# Patient Record
Sex: Male | Born: 1950 | Race: Black or African American | Hispanic: No | Smoking: Former smoker
Health system: Southern US, Community
[De-identification: ages and names within clinical notes are randomized; demographics above are authoritative.]

## PROBLEM LIST (undated history)

## (undated) DIAGNOSIS — G8929 Other chronic pain: Secondary | ICD-10-CM

## (undated) DIAGNOSIS — E878 Other disorders of electrolyte and fluid balance, not elsewhere classified: Secondary | ICD-10-CM

## (undated) DIAGNOSIS — J9819 Other pulmonary collapse: Secondary | ICD-10-CM

## (undated) DIAGNOSIS — M549 Dorsalgia, unspecified: Secondary | ICD-10-CM

## (undated) DIAGNOSIS — I10 Essential (primary) hypertension: Secondary | ICD-10-CM

## (undated) HISTORY — PX: FRACTURE SURGERY: SHX138

---

## 2017-06-01 ENCOUNTER — Emergency Department
Admission: EM | Admit: 2017-06-01 | Discharge: 2017-06-01 | Disposition: A | Discharge status: Home / Self Care | Payer: Medicare Other | Attending: Emergency Medicine | Admitting: Emergency Medicine

## 2017-06-01 ENCOUNTER — Other Ambulatory Visit: Payer: Self-pay

## 2017-06-01 ENCOUNTER — Emergency Department: Payer: Medicare Other

## 2017-06-01 DIAGNOSIS — I10 Essential (primary) hypertension: Secondary | ICD-10-CM | POA: Insufficient documentation

## 2017-06-01 DIAGNOSIS — Y929 Unspecified place or not applicable: Secondary | ICD-10-CM | POA: Diagnosis not present

## 2017-06-01 DIAGNOSIS — Y939 Activity, unspecified: Secondary | ICD-10-CM | POA: Diagnosis not present

## 2017-06-01 DIAGNOSIS — Z87891 Personal history of nicotine dependence: Secondary | ICD-10-CM | POA: Diagnosis not present

## 2017-06-01 DIAGNOSIS — M898X1 Other specified disorders of bone, shoulder: Secondary | ICD-10-CM

## 2017-06-01 DIAGNOSIS — Y999 Unspecified external cause status: Secondary | ICD-10-CM | POA: Insufficient documentation

## 2017-06-01 DIAGNOSIS — X58XXXA Exposure to other specified factors, initial encounter: Secondary | ICD-10-CM | POA: Insufficient documentation

## 2017-06-01 DIAGNOSIS — S42018A Nondisplaced fracture of sternal end of left clavicle, initial encounter for closed fracture: Secondary | ICD-10-CM | POA: Diagnosis not present

## 2017-06-01 DIAGNOSIS — S4992XA Unspecified injury of left shoulder and upper arm, initial encounter: Secondary | ICD-10-CM | POA: Diagnosis present

## 2017-06-01 HISTORY — DX: Other pulmonary collapse: J98.19

## 2017-06-01 HISTORY — DX: Dorsalgia, unspecified: M54.9

## 2017-06-01 HISTORY — DX: Essential (primary) hypertension: I10

## 2017-06-01 HISTORY — DX: Other chronic pain: G89.29

## 2017-06-01 HISTORY — DX: Other disorders of electrolyte and fluid balance, not elsewhere classified: E87.8

## 2017-06-01 LAB — BASIC METABOLIC PANEL
ANION GAP: 10 (ref 5–15)
BUN: 21 mg/dL — ABNORMAL HIGH (ref 6–20)
CHLORIDE: 105 mmol/L (ref 101–111)
CO2: 21 mmol/L — AB (ref 22–32)
Calcium: 9.4 mg/dL (ref 8.9–10.3)
Creatinine, Ser: 1.08 mg/dL (ref 0.61–1.24)
GFR calc non Af Amer: >60 mL/min (ref >60)
GLUCOSE: 83 mg/dL (ref 65–99)
POTASSIUM: 3.9 mmol/L (ref 3.5–5.1)
Sodium: 136 mmol/L (ref 135–145)

## 2017-06-01 LAB — CBC
HCT: 44.8 % (ref 40.0–52.0)
HEMOGLOBIN: 14.8 g/dL (ref 13.0–18.0)
MCH: 29.7 pg (ref 26.0–34.0)
MCHC: 33 g/dL (ref 32.0–36.0)
MCV: 89.9 fL (ref 80.0–100.0)
Platelets: 215 10*3/uL (ref 150–440)
RBC: 4.98 MIL/uL (ref 4.40–5.90)
RDW: 15 % — ABNORMAL HIGH (ref 11.5–14.5)
WBC: 7.8 10*3/uL (ref 3.8–10.6)

## 2017-06-01 LAB — URIC ACID: URIC ACID, SERUM: 7.8 mg/dL — AB (ref 4.4–7.6)

## 2017-06-01 LAB — SEDIMENTATION RATE: Sed Rate: 11 mm/hr (ref 0–20)

## 2017-06-01 MED ORDER — DEXAMETHASONE SODIUM PHOSPHATE 10 MG/ML IJ SOLN
INTRAMUSCULAR | Status: AC
  Filled 2017-06-01: qty 1

## 2017-06-01 MED ORDER — HYDROCODONE-ACETAMINOPHEN 5-325 MG PO TABS: 1.0000 | ORAL_TABLET | Freq: Four times a day (QID) | ORAL | 0 refills | Status: AC | PRN

## 2017-06-01 MED ORDER — DEXAMETHASONE SODIUM PHOSPHATE 10 MG/ML IJ SOLN
10.0000 mg | Freq: Once | INTRAMUSCULAR | Status: AC
  Administered 2017-06-01: 10 mg via INTRAMUSCULAR
  Filled 2017-06-01: qty 1

## 2017-06-01 NOTE — ED Notes (Signed)
Pt reports that his L shoulder started hurting yesterday and that now he can barely move his L arm/shoulder.  Pt denies injury.  Pt reports that he did not fall.  Pt states that he cannot put on clothing due to pain.  Pt is A&Ox4, in NAD.

## 2017-06-01 NOTE — ED Triage Notes (Signed)
Pt c/o left shoulder/collar bone pain since yesterday morning. Denies injury. States he is unable to move it due to pain. States he is having a lot of muscle spasms.

## 2017-06-01 NOTE — ED Provider Notes (Signed)
[facility] EMERGENCY DEPARTMENT Provider Note   CSN: [id number] Arrival date & time: 06/01/17  1525     History   Chief Complaint Chief Complaint  Patient presents with  . Shoulder Pain    HPI George English is a 67 y.o. male.  HPI  Past Medical History:  Diagnosis Date  . Chronic back pain   . Collapsed lung   . Hyperchloremia   . Hypertension     There are no active problems to display for this patient.   Past Surgical History:  Procedure Laterality Date  . FRACTURE SURGERY         Home Medications    Prior to Admission medications   Medication Sig Start Date End Date Taking? Authorizing Provider  HYDROcodone-acetaminophen (NORCO) 5-325 MG tablet Take 1 tablet by mouth every 6 (six) hours as needed for moderate pain. 06/01/17   Evon SlackGaines, Thomas C, PA-C    Family History No family history on file.  Social History Social History   Tobacco Use  . Smoking status: Former Games developermoker  . Smokeless tobacco: Never Used  Substance Use Topics  . Alcohol use: Not on file    Comment: former  . Drug use: Not on file     Allergies   Patient has no known allergies.   Review of Systems Review of Systems  Constitutional: Negative for fatigue and fever.  Respiratory: Negative for shortness of breath.   Cardiovascular: Negative for chest pain.  Gastrointestinal: Negative for nausea and vomiting.  Musculoskeletal: Positive for arthralgias. Negative for neck pain and neck stiffness.  Neurological: Negative for dizziness, weakness, numbness and headaches.     Physical Exam Updated Vital Signs BP 113/67 (BP Location: Right Arm)   Pulse 77   Temp 98.4 F (36.9 C) (Oral)   Resp 17   Ht 5\' 8"  (1.727 m)   Wt 77.6 kg (171 lb)   SpO2 97%   BMI 26.00 kg/m   Physical Exam  Constitutional: He is oriented to person, place, and time. He appears well-developed and well-nourished.  HENT:  Head: Normocephalic and atraumatic.  Eyes:  Conjunctivae are normal.  Neck: Normal range of motion.  Cardiovascular: Normal rate.  Pulmonary/Chest: Effort normal. No respiratory distress. He has no wheezes. He has no rales. He exhibits no tenderness.  Abdominal: Soft. He exhibits no distension. There is no tenderness.  Musculoskeletal: Normal range of motion.  Examination of the left shoulder shows patient has tenderness along the sternoclavicular joint with no tenderness throughout the shaft of the clavicle or AC joint.  There is mild swelling, mild warmth to the sternoclavicular joint.  There is no fluct[facility].  Patient has painful active range of motion of left shoulder with negative Hawkins impingement signs.  He has good internal and external rotation with no discomfort.  Neurovascular intact left upper extremity.  Nontender throughout the sternum  Neurological: He is alert and oriented to person, place, and time.  Skin: Skin is warm. No rash noted.  Psychiatric: He has a normal mood and affect. His behavior is normal. Thought content normal.     ED Treatments / Results  Labs (all labs ordered are listed, but only abnormal results are displayed) Labs Reviewed  CBC - Abnormal; Notable for the following components:      Result Value   RDW 15.0 (*)    All other components within normal limits  BASIC METABOLIC PANEL - Abnormal; Notable for the following components:  CO2 21 (*)    BUN 21 (*)    All other components within normal limits  URIC ACID - Abnormal; Notable for the following components:   Uric Acid, Serum 7.8 (*)    All other components within normal limits  SEDIMENTATION RATE    EKG  EKG Interpretation None       Radiology Dg Clavicle Left  Result Date: 06/01/2017 CLINICAL DATA:  Left shoulder pain without known injury. EXAM: LEFT CLAVICLE - 2+ VIEWS COMPARISON:  None. FINDINGS: Mildly displaced fracture is seen involving the medial portion of the left clavicle. Middle and distal  portions appear normal. IMPRESSION: Mildly displaced medial left clavicular fracture. Electronically Signed   By: Lupita Raider, M.D.   On: 06/01/2017 17:44   Dg Shoulder Left  Result Date: 06/01/2017 CLINICAL DATA:  Acute left shoulder pain without known injury. EXAM: LEFT SHOULDER - 2+ VIEW COMPARISON:  None. FINDINGS: There is no evidence of fracture or dislocation. There is no evidence of arthropathy or other focal bone abnormality. Soft tissues are unremarkable. IMPRESSION: Normal left shoulder. Electronically Signed   By: Lupita Raider, M.D.   On: 06/01/2017 17:43    Procedures Procedures (including critical care time)  Medications Ordered in ED Medications  dexamethasone (DECADRON) injection 10 mg (not administered)     Initial Impression / Assessment and Plan / ED Course  I have reviewed the triage vital signs and the nursing notes.  Pertinent labs & imaging results that were available during my care of the patient were reviewed by me and considered in my medical decision making (see chart for details).    67 year old male with nondisplaced proximal clavicle fracture along the sternoclavicular joint.  Patient is placed into a sling.  He is given Norco for pain.  He will follow-up with orthopedics in 7 days if no improvement. Final Clinical Impressions(s) / ED Diagnoses   Final diagnoses:  Pain of left clavicle  Closed nondisplaced fracture of sternal end of left clavicle, initial encounter    ED Discharge Orders        Ordered    HYDROcodone-acetaminophen (NORCO) 5-325 MG tablet  Every 6 hours PRN     06/01/17 1900       Ronnette Juniper 06/01/17 1910    Sharyn Creamer, MD 06/01/17 2356

## 2017-06-01 NOTE — Discharge Instructions (Signed)
Please wear sling as needed for comfort.  Follow-up with orthopedics in 7-10 days for repeat x-rays of the left clavicle.  Return to the ER for any increasing pain, swelling, worsening symptoms or urgent changes in your health.

## 2017-06-01 NOTE — ED Notes (Signed)
See triage note  Presents with pain to left clavicle area   And pain moves into left shoulder with movement   Denies any recent injury

## 2019-04-25 IMAGING — CR DG SHOULDER 2+V*L*
3 series · 3 of 3 positions shown · non-contrast
Comparison: None.

CLINICAL DATA: Acute left shoulder pain without known injury.

EXAM:
LEFT SHOULDER - 2+ VIEW

[shoulder grashey]
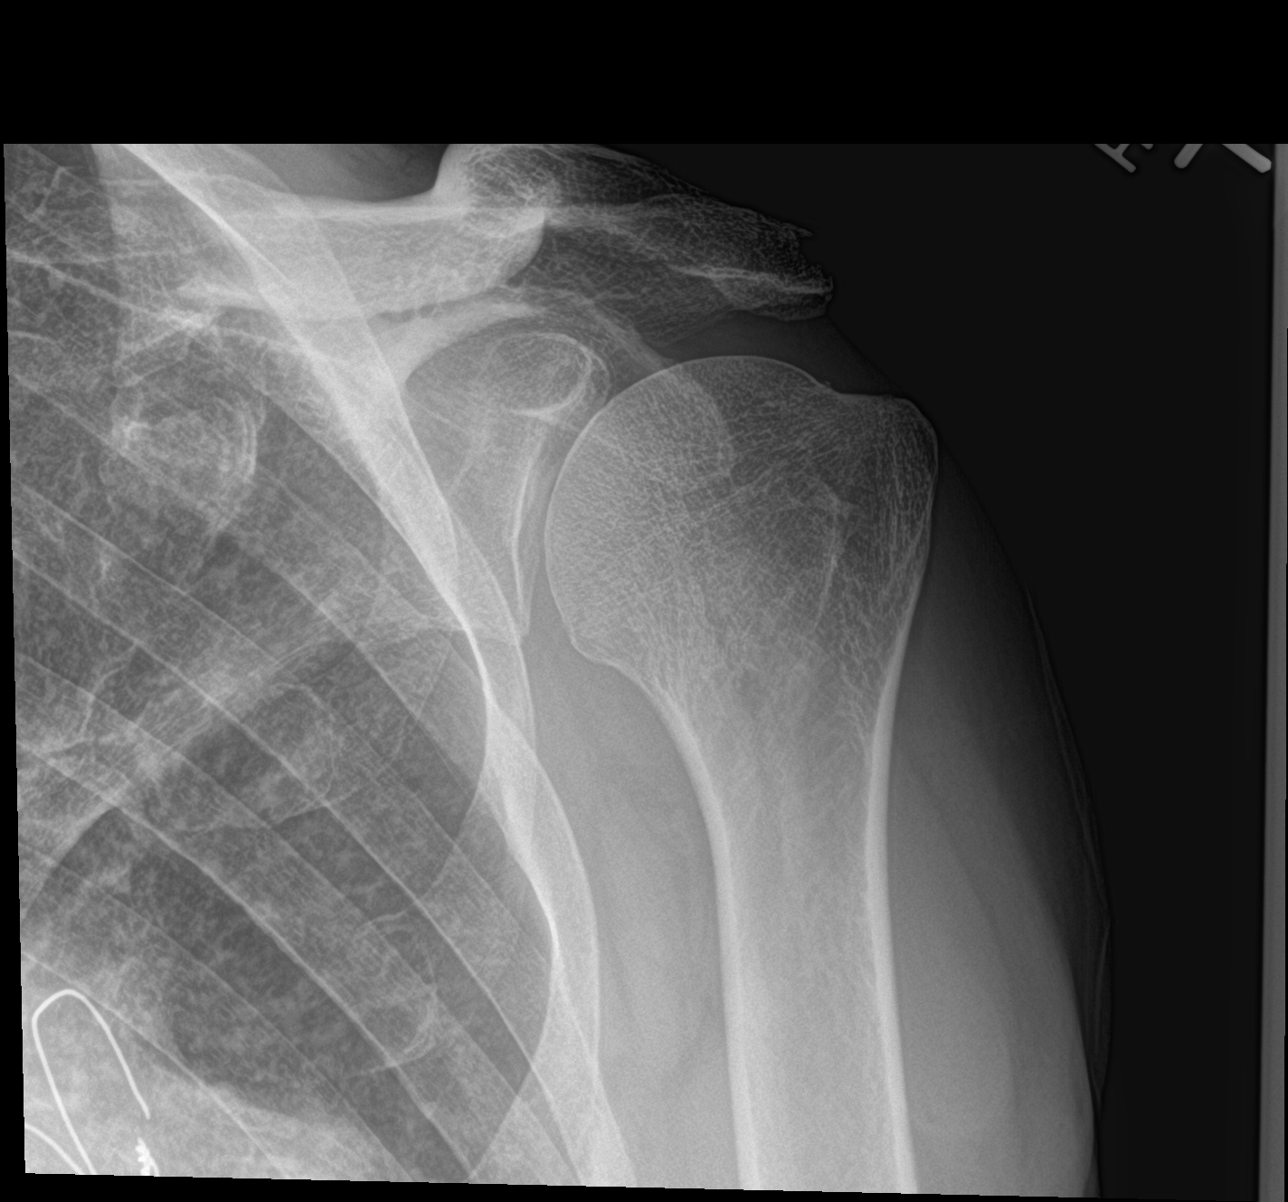

[shoulder y view]
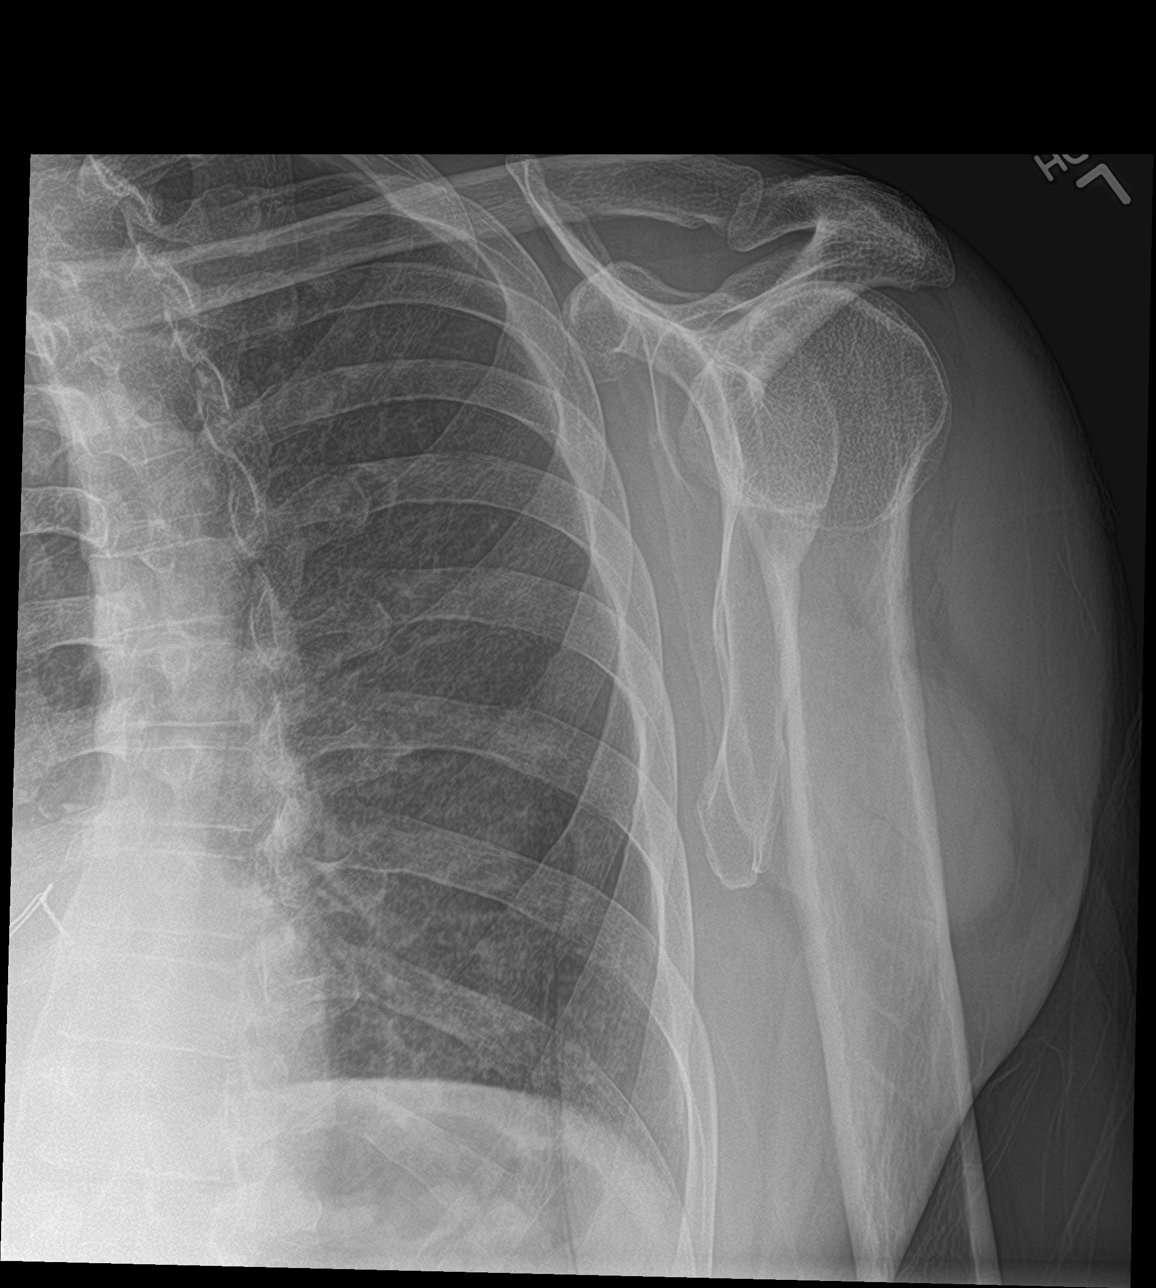

[shoulder axillary]
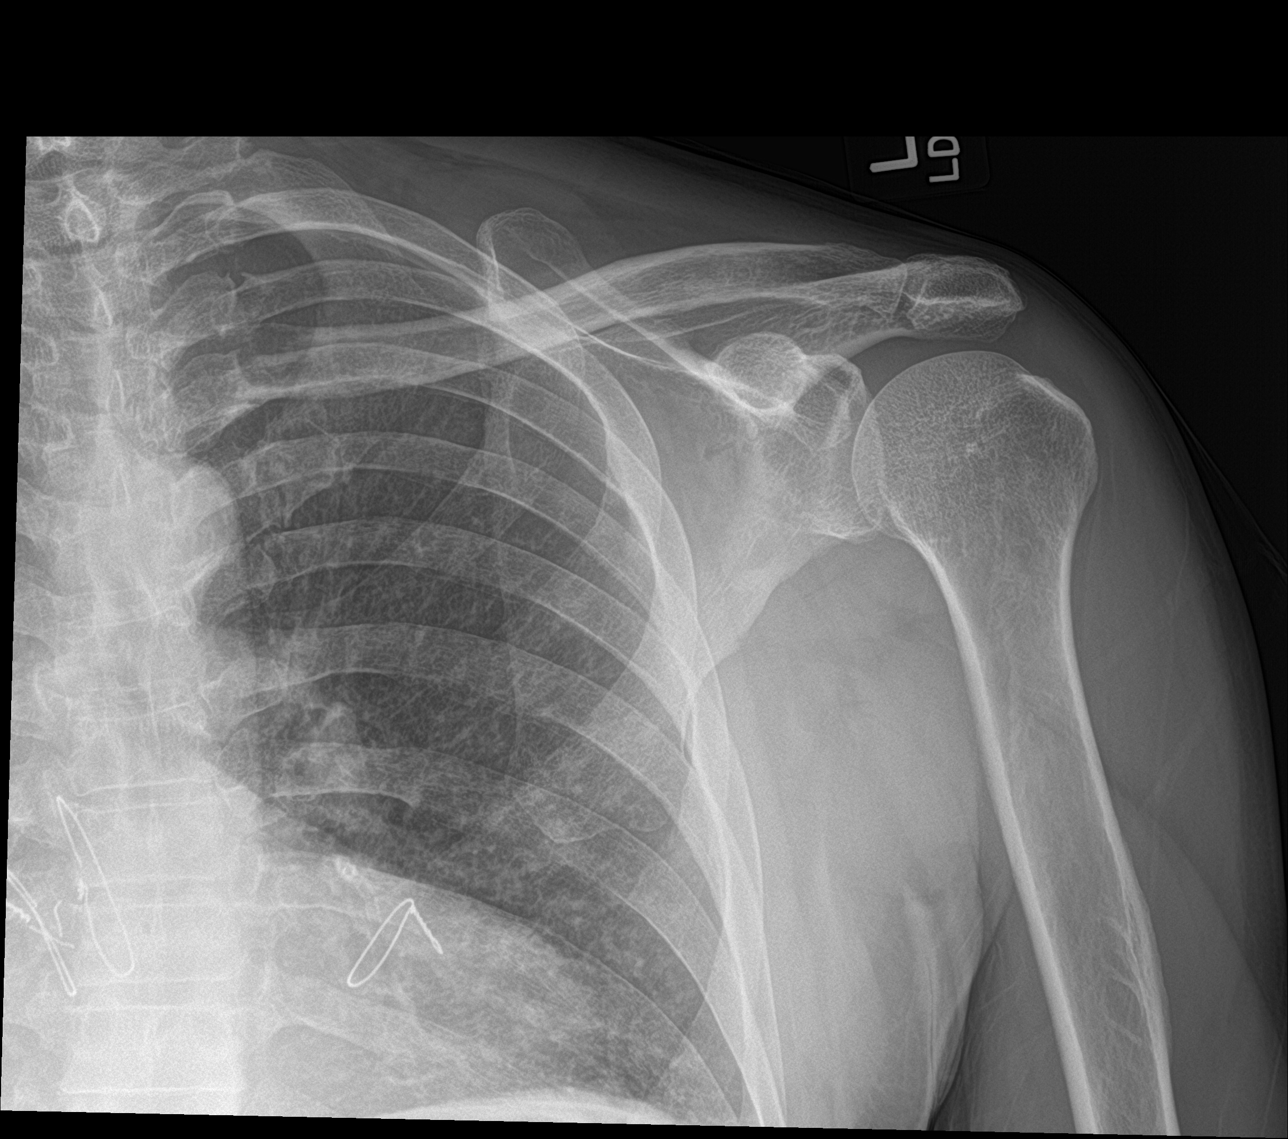

[3 of 3 positions shown; findings below may reference images not displayed]

FINDINGS: There is no evidence of fracture or dislocation. There is no
evidence of arthropathy or other focal bone abnormality. Soft
tissues are unremarkable.
IMPRESSION: Normal left shoulder.
